# Patient Record
Sex: Female | Born: 1995 | Race: Black or African American | Hispanic: No | Marital: Single | Smoking: Never smoker
Health system: Southern US, Community
[De-identification: ages and names within clinical notes are randomized; demographics above are authoritative.]

## PROBLEM LIST (undated history)

## (undated) DIAGNOSIS — J45909 Unspecified asthma, uncomplicated: Secondary | ICD-10-CM

## (undated) HISTORY — DX: Unspecified asthma, uncomplicated: J45.909

## (undated) HISTORY — PX: CHOLECYSTECTOMY: SHX55

## (undated) HISTORY — PX: WISDOM TOOTH EXTRACTION: SHX21

---

## 2020-02-05 ENCOUNTER — Emergency Department (HOSPITAL_BASED_OUTPATIENT_CLINIC_OR_DEPARTMENT_OTHER)
Admission: EM | Admit: 2020-02-05 | Discharge: 2020-02-05 | Disposition: A | Discharge status: Home / Self Care | Payer: Medicaid Other | Attending: Emergency Medicine | Admitting: Emergency Medicine

## 2020-02-05 ENCOUNTER — Emergency Department (HOSPITAL_BASED_OUTPATIENT_CLINIC_OR_DEPARTMENT_OTHER): Payer: Medicaid Other

## 2020-02-05 ENCOUNTER — Other Ambulatory Visit: Payer: Self-pay

## 2020-02-05 ENCOUNTER — Encounter (HOSPITAL_BASED_OUTPATIENT_CLINIC_OR_DEPARTMENT_OTHER): Payer: Self-pay

## 2020-02-05 DIAGNOSIS — M25561 Pain in right knee: Secondary | ICD-10-CM

## 2020-02-05 DIAGNOSIS — J45909 Unspecified asthma, uncomplicated: Secondary | ICD-10-CM | POA: Diagnosis not present

## 2020-02-05 NOTE — Discharge Instructions (Addendum)
Ibuprofen or aleve for pain. Please refer to the attached instructions. Follow-up with Dr. Jordan Likes if no improvement over the next several days.

## 2020-02-05 NOTE — ED Triage Notes (Signed)
Pt c/o right knee pain x 2-3 days-denies known injury-NAD-steady gait

## 2020-02-05 NOTE — ED Provider Notes (Signed)
[facility] EMERGENCY DEPARTMENT Provider Note   CSN: [id number] Arrival date & time: 02/05/20  1248     History Chief Complaint  Patient presents with  . Knee Pain    Alexandra Myers is a 24 y.o. female.  Patient report sudden onset of right knee pain 2 days ago. No known injury. She works in the [employer], is on her feet most of the day. No previous injury to the area. She has not tried any RICEM treatments.  The history is provided by the patient. No language interpreter was used.  Knee Pain Location:  Knee Time since incident:  2 days Injury: no   Knee location:  R knee Pain details:    Quality:  Throbbing   Severity:  Moderate   Onset quality:  Sudden   Duration:  2 days   Timing:  Intermittent   Progression:  Unchanged Chronicity:  New Dislocation: no   Prior injury to area:  No Ineffective treatments:  None tried Associated symptoms: no decreased ROM        Past Medical History:  Diagnosis Date  . Asthma     There are no problems to display for this patient.   Past Surgical History:  Procedure Laterality Date  . CHOLECYSTECTOMY    . WISDOM TOOTH EXTRACTION       OB History   No obstetric history on file.     No family history on file.  Social History   Tobacco Use  . Smoking status: Never Smoker  . Smokeless tobacco: Never Used  Substance Use Topics  . Alcohol use: Yes    Comment: occ  . Drug use: Never    Home Medications Prior to Admission medications   Not on File    Allergies    Flagyl [metronidazole], Penicillins, and Shellfish allergy  Review of Systems   Review of Systems  All other systems reviewed and are negative.   Physical Exam Updated Vital Signs BP 134/74 (BP Location: Left Arm)   Pulse 74   Temp 98.9 F (37.2 C) (Oral)   Resp 16   Ht 5\' 9"  (1.753 m)   Wt (!) 142.6 kg   SpO2 99%   BMI 46.41 kg/m   Physical Exam Vitals and nursing note reviewed.  Constitutional:      Appearance:  Normal appearance.  HENT:     Head: Normocephalic.     Nose: Nose normal.  Eyes:     Conjunctiva/sclera: Conjunctivae normal.  Cardiovascular:     Rate and Rhythm: Normal rate.  Pulmonary:     Effort: Pulmonary effort is normal.  Musculoskeletal:        General: No swelling or deformity. Normal range of motion.     Right knee: No swelling, deformity, effusion or erythema. No LCL laxity, MCL laxity, ACL laxity or PCL laxity.  Skin:    General: Skin is warm and dry.  Neurological:     Mental Status: She is alert and oriented to person, place, and time.  Psychiatric:        Mood and Affect: Mood normal.     ED Results / Procedures / Treatments   Labs (all labs ordered are listed, but only abnormal results are displayed) Labs Reviewed - No data to display  EKG None  Radiology DG Knee Complete 4 Views Right  Result Date: 02/05/2020 CLINICAL DATA:  Pain EXAM: RIGHT KNEE - COMPLETE 4+ VIEW COMPARISON:  None. FINDINGS: Frontal, lateral, and bilateral oblique views were  obtained. No fracture or dislocation. No joint effusion. Joint spaces appear normal. No erosive change. IMPRESSION: No fracture, dislocation, or joint effusion. No evident arthropathy. Electronically Signed   By: Bretta Bang III M.D.   On: 02/05/2020 13:22    Procedures Procedures (including critical care time)  Medications Ordered in ED Medications - No data to display  ED Course  I have reviewed the triage vital signs and the nursing notes.  Pertinent labs & imaging results that were available during my care of the patient were reviewed by me and considered in my medical decision making (see chart for details).    MDM Rules/Calculators/A&P                          Patient X-Ray negative for obvious fracture or dislocation. No indication of infection. Pt advised to follow up with orthopedics. Patient given knee immobilizer while in ED, conservative therapy recommended and discussed. Patient will be  discharged home & is agreeable with above plan. Returns precautions discussed. Pt appears safe for discharge. Final Clinical Impression(s) / ED Diagnoses Final diagnoses:  Acute pain of right knee    Rx / DC Orders ED Discharge Orders    None       Felicie Morn, NP 02/05/20 1621    Terrilee Files, MD 02/05/20 [phone]

## 2020-04-06 ENCOUNTER — Telehealth: Payer: Self-pay | Admitting: Family Medicine

## 2020-04-06 NOTE — Telephone Encounter (Signed)
Per patient knee braced worked well, doesn't need ED f/u appt--glh

## 2021-07-29 IMAGING — CR DG KNEE COMPLETE 4+V*R*
4 series · 4 of 4 positions shown · non-contrast
Comparison: None.

CLINICAL DATA: Pain

EXAM:
RIGHT KNEE - COMPLETE 4+ VIEW

[t knee ap right]
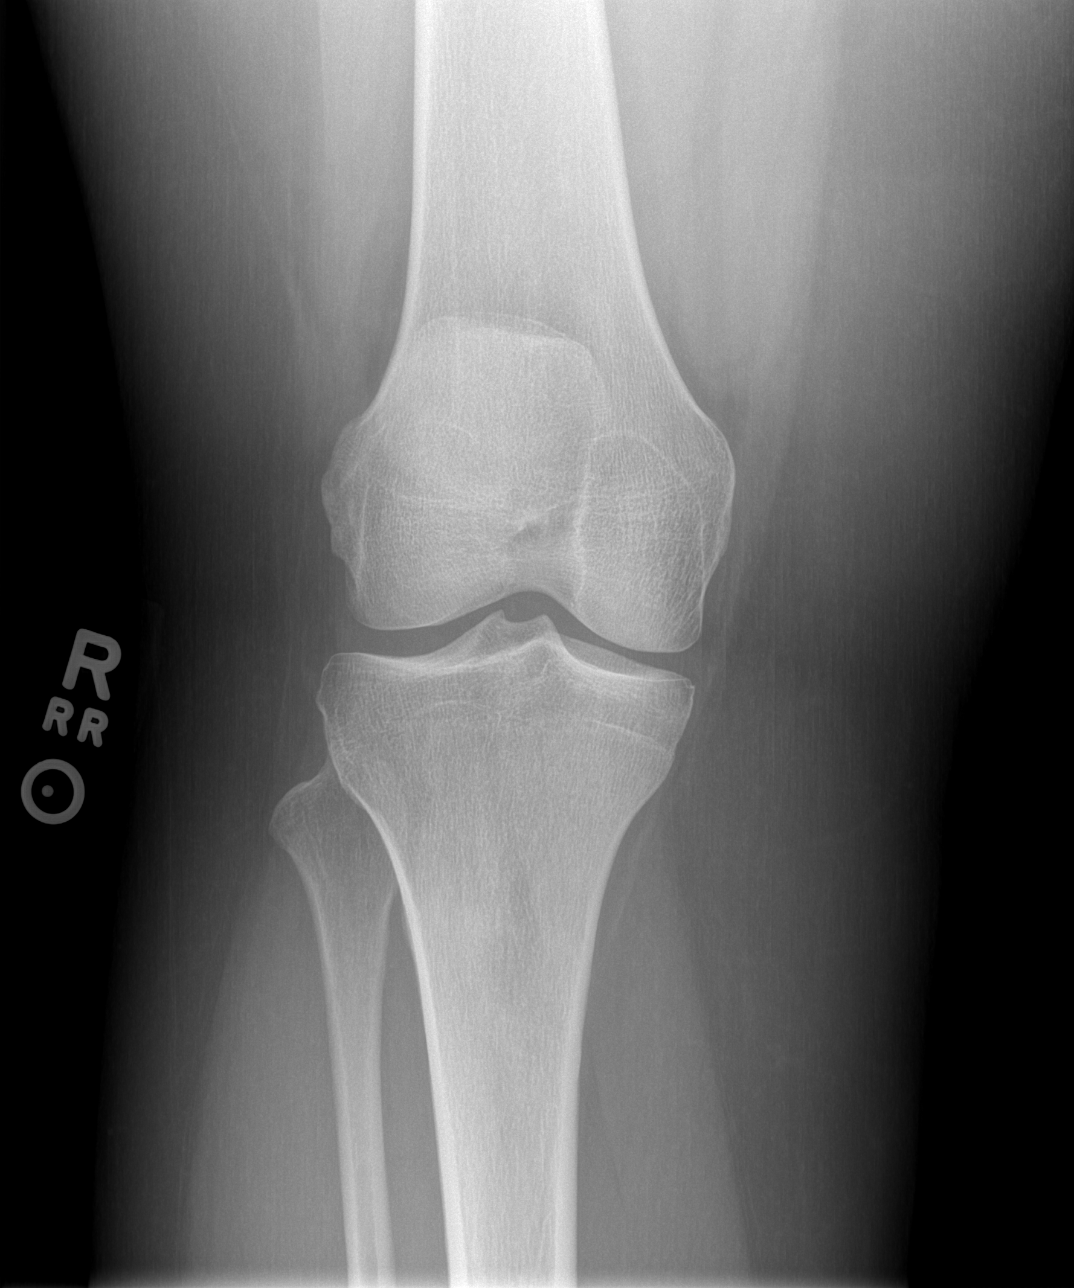

[t knee oblique right (1 of 2)]
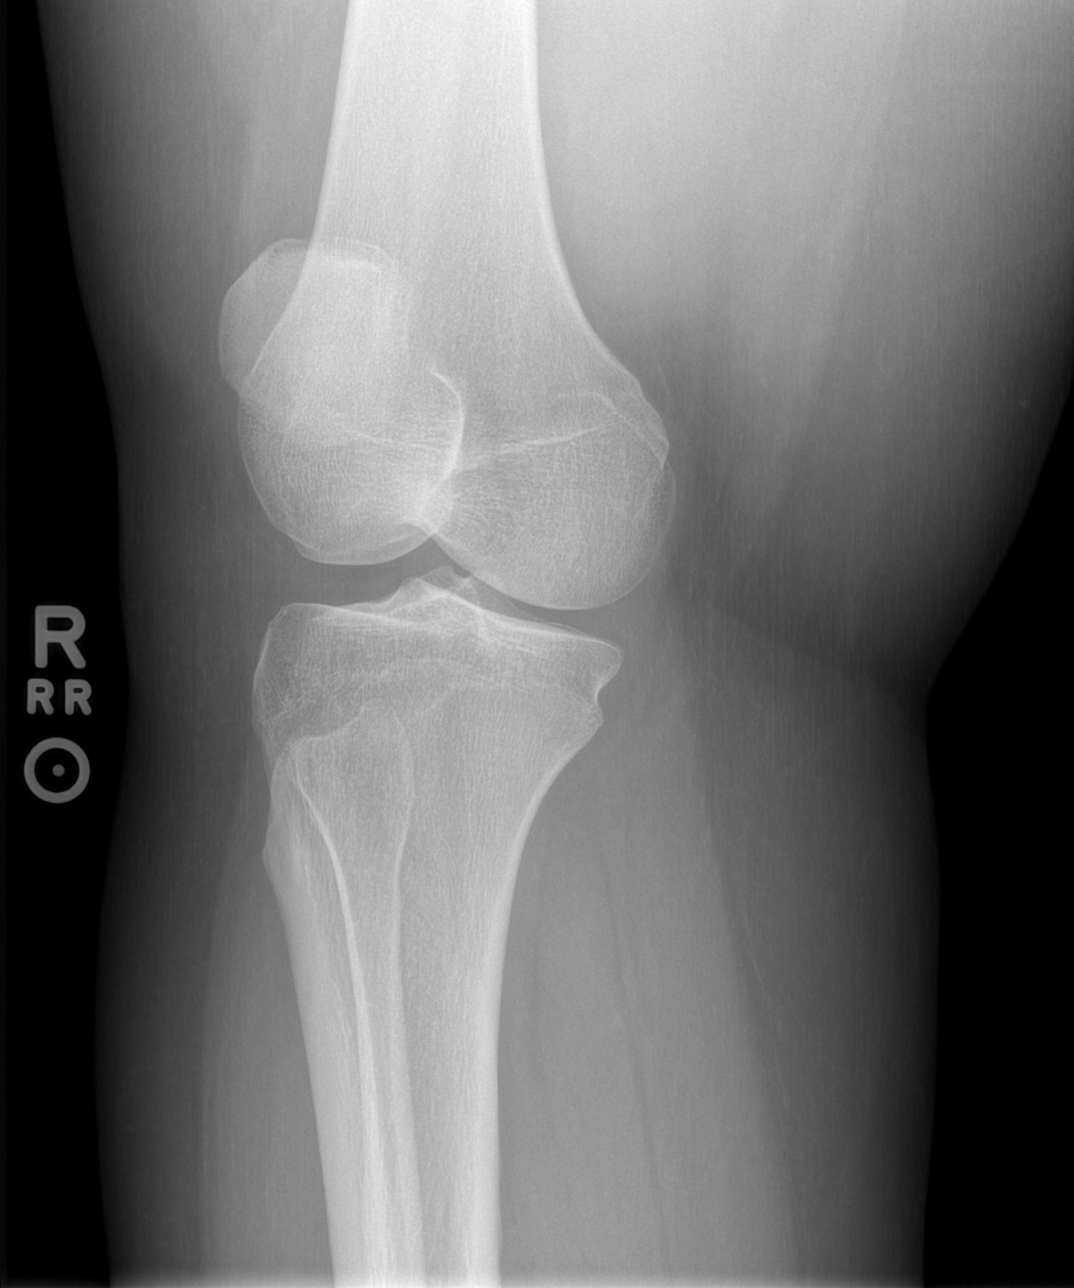

[t knee oblique right (2 of 2)]
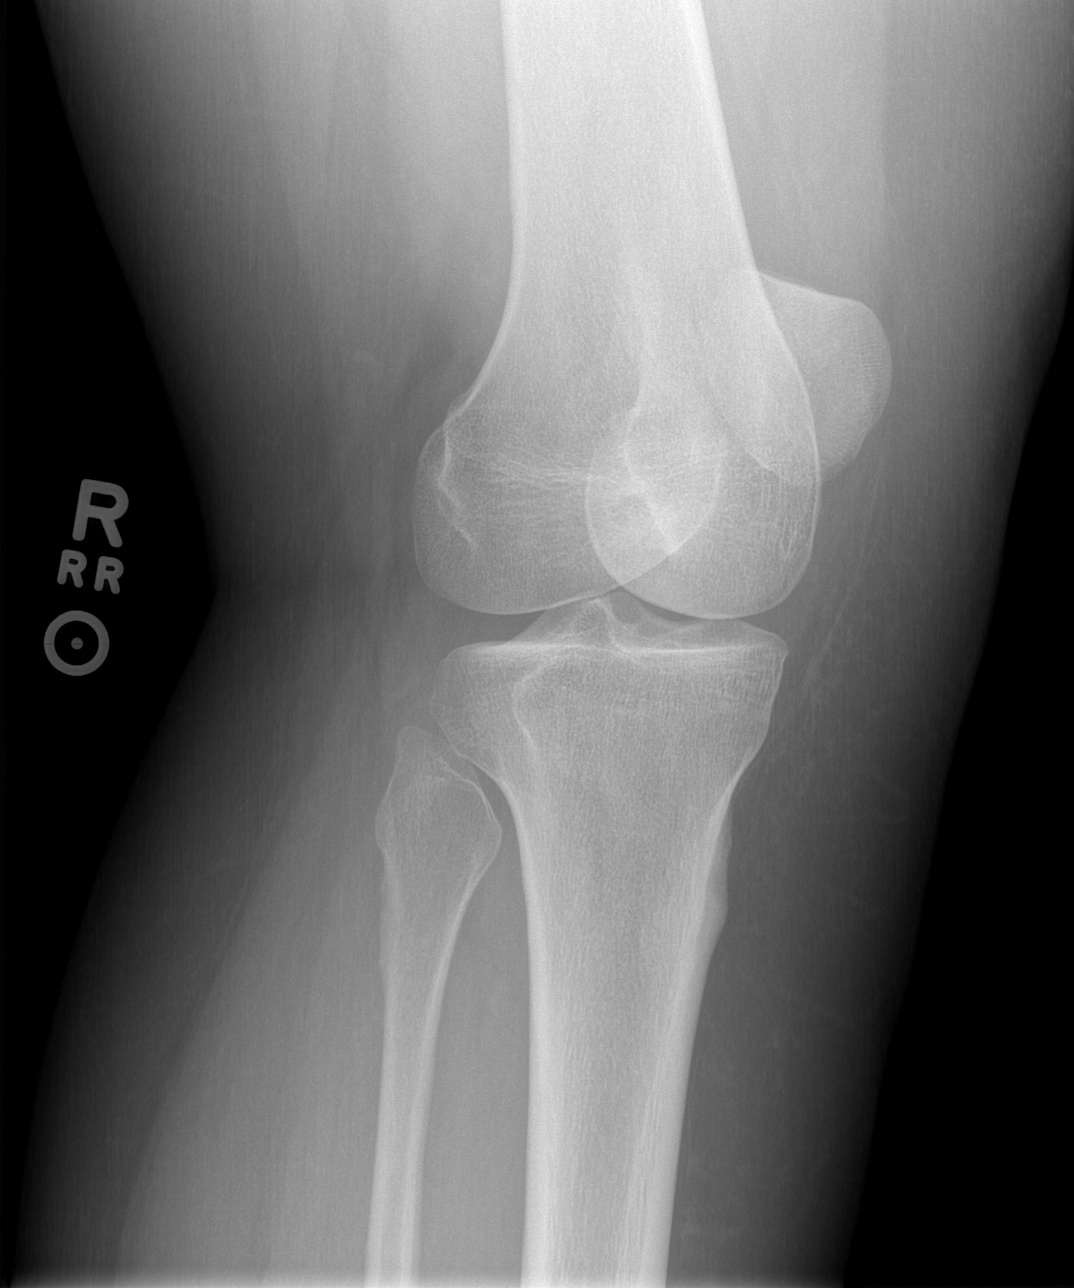

[t knee lat right]
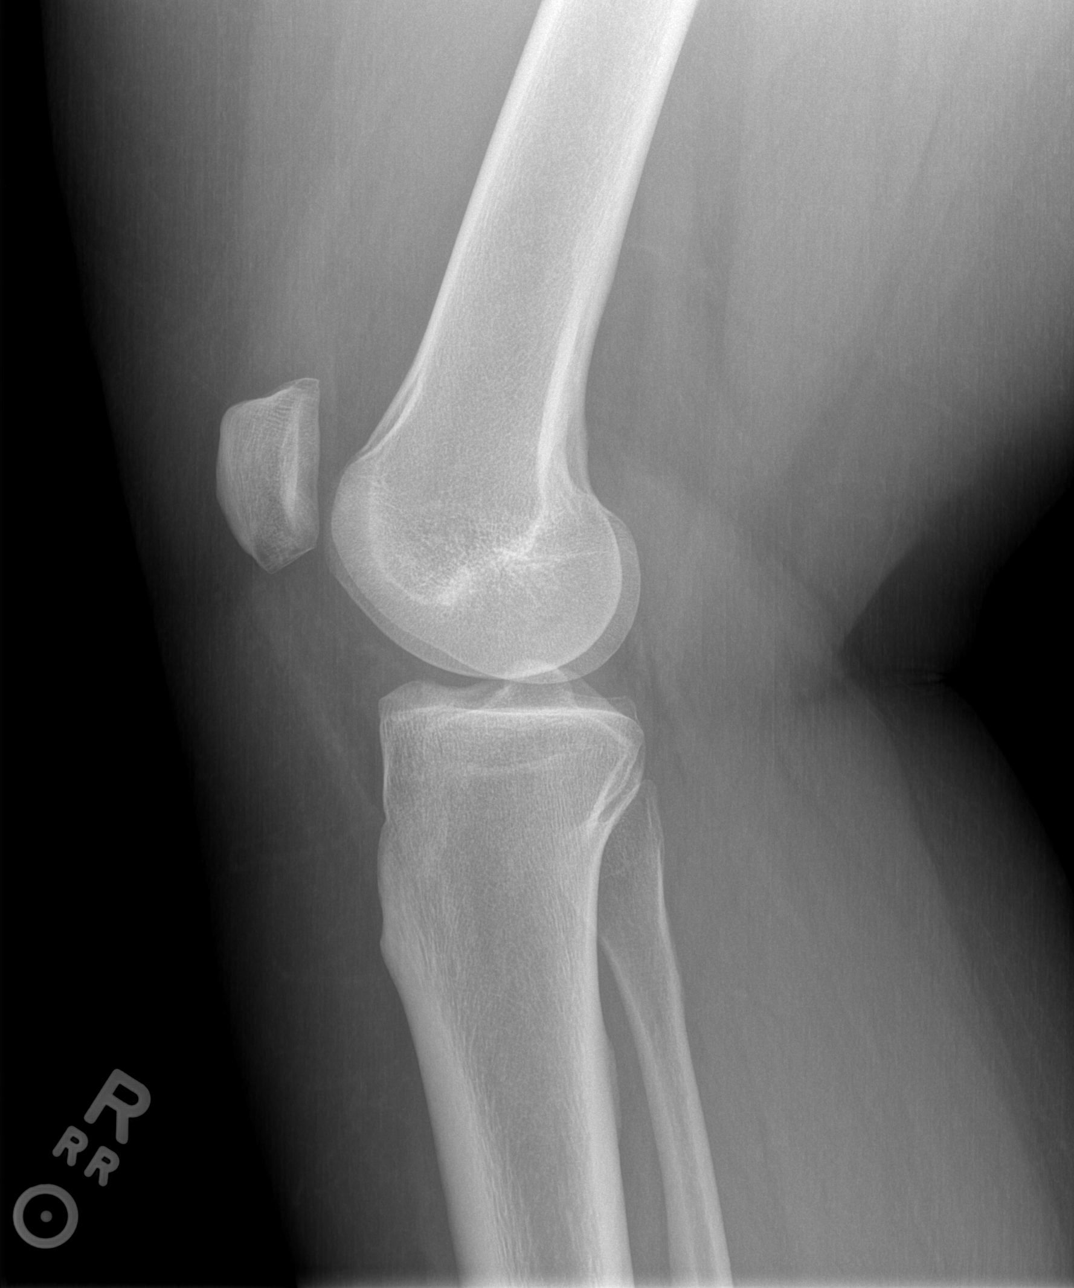

[4 of 4 positions shown; findings below may reference images not displayed]

FINDINGS: Frontal, lateral, and bilateral oblique views were obtained. No
fracture or dislocation. No joint effusion. Joint spaces appear
normal. No erosive change.
IMPRESSION: No fracture, dislocation, or joint effusion. No evident arthropathy.

## 2022-09-15 ENCOUNTER — Encounter: Payer: Self-pay | Admitting: *Deleted
